# Patient Record
Sex: Male | Born: 1970
Health system: Southern US, Community
[De-identification: ages and names within clinical notes are randomized; demographics above are authoritative.]

---

## 2001-01-29 ENCOUNTER — Emergency Department (HOSPITAL_COMMUNITY): Admission: EM | Admit: 2001-01-29 | Discharge: 2001-01-29 | Payer: Self-pay | Admitting: Emergency Medicine

## 2001-04-16 ENCOUNTER — Emergency Department (HOSPITAL_COMMUNITY): Admission: EM | Admit: 2001-04-16 | Discharge: 2001-04-16 | Payer: Self-pay

## 2001-04-24 ENCOUNTER — Emergency Department (HOSPITAL_COMMUNITY): Admission: EM | Admit: 2001-04-24 | Discharge: 2001-04-24 | Payer: Self-pay | Admitting: Emergency Medicine

## 2001-04-26 ENCOUNTER — Emergency Department (HOSPITAL_COMMUNITY): Admission: EM | Admit: 2001-04-26 | Discharge: 2001-04-26 | Payer: Self-pay | Admitting: Emergency Medicine

## 2001-05-11 ENCOUNTER — Emergency Department (HOSPITAL_COMMUNITY): Admission: EM | Admit: 2001-05-11 | Discharge: 2001-05-11 | Payer: Self-pay

## 2001-05-12 ENCOUNTER — Emergency Department (HOSPITAL_COMMUNITY): Admission: EM | Admit: 2001-05-12 | Discharge: 2001-05-12 | Payer: Self-pay | Admitting: Emergency Medicine

## 2005-01-22 ENCOUNTER — Emergency Department (HOSPITAL_COMMUNITY): Admission: EM | Admit: 2005-01-22 | Discharge: 2005-01-22 | Payer: Self-pay | Admitting: Emergency Medicine

## 2015-03-12 ENCOUNTER — Emergency Department (HOSPITAL_COMMUNITY)
Admission: EM | Admit: 2015-03-12 | Discharge: 2015-03-12 | Disposition: A | Discharge status: Home / Self Care | Payer: No Typology Code available for payment source | Attending: Emergency Medicine | Admitting: Emergency Medicine

## 2015-03-12 ENCOUNTER — Encounter (HOSPITAL_COMMUNITY): Payer: Self-pay

## 2015-03-12 DIAGNOSIS — Y998 Other external cause status: Secondary | ICD-10-CM | POA: Insufficient documentation

## 2015-03-12 DIAGNOSIS — Y9289 Other specified places as the place of occurrence of the external cause: Secondary | ICD-10-CM | POA: Diagnosis not present

## 2015-03-12 DIAGNOSIS — T63461A Toxic effect of venom of wasps, accidental (unintentional), initial encounter: Secondary | ICD-10-CM

## 2015-03-12 DIAGNOSIS — Y9389 Activity, other specified: Secondary | ICD-10-CM | POA: Insufficient documentation

## 2015-03-12 DIAGNOSIS — T63421A Toxic effect of venom of ants, accidental (unintentional), initial encounter: Secondary | ICD-10-CM | POA: Insufficient documentation

## 2015-03-12 DIAGNOSIS — X58XXXA Exposure to other specified factors, initial encounter: Secondary | ICD-10-CM | POA: Insufficient documentation

## 2015-03-12 DIAGNOSIS — Z23 Encounter for immunization: Secondary | ICD-10-CM | POA: Insufficient documentation

## 2015-03-12 DIAGNOSIS — M7989 Other specified soft tissue disorders: Secondary | ICD-10-CM | POA: Diagnosis present

## 2015-03-12 LAB — COMPREHENSIVE METABOLIC PANEL
ALT: 16 U/L — ABNORMAL LOW (ref 17–63)
AST: 21 U/L (ref 15–41)
Albumin: 3.9 g/dL (ref 3.5–5.0)
Alkaline Phosphatase: 56 U/L (ref 38–126)
Anion gap: 7 (ref 5–15)
BUN: 11 mg/dL (ref 6–20)
CO2: 27 mmol/L (ref 22–32)
Calcium: 9.3 mg/dL (ref 8.9–10.3)
Chloride: 105 mmol/L (ref 101–111)
Creatinine, Ser: 1.07 mg/dL (ref 0.61–1.24)
GFR calc Af Amer: >60 mL/min (ref >60)
GFR calc non Af Amer: >60 mL/min (ref >60)
Glucose, Bld: 98 mg/dL (ref 65–99)
Potassium: 4 mmol/L (ref 3.5–5.1)
Sodium: 139 mmol/L (ref 135–145)
Total Bilirubin: 0.5 mg/dL (ref 0.3–1.2)
Total Protein: 7.1 g/dL (ref 6.5–8.1)

## 2015-03-12 MED ORDER — TETANUS-DIPHTH-ACELL PERTUSSIS 5-2.5-18.5 LF-MCG/0.5 IM SUSP
0.5000 mL | Freq: Once | INTRAMUSCULAR | Status: AC
  Administered 2015-03-12: 0.5 mL via INTRAMUSCULAR
  Filled 2015-03-12: qty 0.5

## 2015-03-12 MED ORDER — HYDROCHLOROTHIAZIDE 25 MG PO TABS: 12.5000 mg | ORAL_TABLET | Freq: Every day | ORAL | Status: DC

## 2015-03-12 NOTE — ED Notes (Signed)
Dr. Jacubowitz at the bedside 

## 2015-03-12 NOTE — ED Provider Notes (Signed)
CSN: [id number]     Arrival date & time 03/12/15  4098 History   First MD Initiated Contact with Patient 03/12/15 0930     No chief complaint on file.  chief complaint: Stung by wasp"   (Consider location/radiation/quality/duration/timing/severity/associated sxs/prior Treatment) HPI Patient reports he was stung on his left lateral lower leg by a wasp 3 days ago he complains of "stiffness"at his left lower leg since the event due to swelling. He also wants to be checked out for swelling in both legs which has been there for at least 6 months.. No shortness of breath No other associated symptoms no treatment prior to coming here. Bilateral leg swelling worse with prolonged standing improved with elevation of his legs. Discomfort at site of wasp sting is minimal  No past medical history on file. past medical history negative  No past surgical history on file. No family history on file. History  Substance Use Topics  . Smoking status: Not on file  . Smokeless tobacco: Not on file  . Alcohol Use: Not on file   ex-smoker quit less than one year ago, drinks 7 or 8 alcoholic beverages per week no illicit drugs  Review of Systems  Constitutional: Negative.   HENT: Negative.   Respiratory: Negative.   Cardiovascular: Positive for leg swelling.  Gastrointestinal: Negative.   Musculoskeletal: Negative.   Skin: Positive for wound.       Wasp sting left lower leg  Neurological: Negative.   Psychiatric/Behavioral: Negative.   All other systems reviewed and are negative.     Allergies  Review of patient's allergies indicates not on file.  Home Medications   Prior to Admission medications   Not on File   There were no vitals taken for this visit. Physical Exam  Constitutional: He appears well-developed and well-nourished.  HENT:  Head: Normocephalic and atraumatic.  Eyes: Conjunctivae are normal. Pupils are equal, round, and reactive to light.  Neck: Neck supple. No JVD present. No  tracheal deviation present. No thyromegaly present.  Cardiovascular: Normal rate and regular rhythm.   No murmur heard. Pulmonary/Chest: Effort normal and breath sounds normal.  Abdominal: Soft. Bowel sounds are normal. He exhibits no distension. There is no tenderness.  Musculoskeletal: Normal range of motion. He exhibits edema. He exhibits no tenderness.  2+ pretibial pitting edema bilaterally. DP pulses 2+ bilaterally no tenderness  Neurological: He is alert. Coordination normal.  Skin: Skin is warm and dry. No rash noted.  Brawny changes bilaterally at lower legs and feet. There is a 3 cm reddened area at left lateral lower leg. No stinger present. No fluctuance. Nontender. No red streaks proximally  Psychiatric: He has a normal mood and affect.  Nursing note and vitals reviewed.   ED Course  Procedures (including critical care time) Labs Review Labs Reviewed - No data to display  Imaging Review No results found.   EKG Interpretation None     tdap administered Results for orders placed or performed during the hospital encounter of 03/12/15  Comprehensive metabolic panel  Result Value Ref Range   Sodium 139 135 - 145 mmol/L   Potassium 4.0 3.5 - 5.1 mmol/L   Chloride 105 101 - 111 mmol/L   CO2 27 22 - 32 mmol/L   Glucose, Bld 98 65 - 99 mg/dL   BUN 11 6 - 20 mg/dL   Creatinine, Ser 1.19 0.61 - 1.24 mg/dL   Calcium 9.3 8.9 - 14.7 mg/dL   Total Protein 7.1 6.5 - 8.1 g/dL  Albumin 3.9 3.5 - 5.0 g/dL   AST 21 15 - 41 U/L   ALT 16 (L) 17 - 63 U/L   Alkaline Phosphatase 56 38 - 126 U/L   Total Bilirubin 0.5 0.3 - 1.2 mg/dL   GFR calc non Af Amer >60 >60 mL/min   GFR calc Af Amer >60 >60 mL/min   Anion gap 7 5 - 15   No results found.  MDM  Patient with localized reaction to wasp sting. No systemic reaction. Brawny pitting bilateral leg edema is chronic.  Final diagnoses:  None   plan prescription HCTZ. Referral [facility] Diagnoses #1 wasp  sting #2 peripheral edema      Doug SouSam Donyel Castagnola, MD 03/12/15 1234

## 2015-03-12 NOTE — ED Notes (Signed)
Pt states he has had swelling noticed in the left leg for a few months but Saturday was bite by a wasp on the side of his left calve. Presents today with redness to leg, swelling, and pain.

## 2015-03-12 NOTE — Discharge Instructions (Signed)
Bee, Wasp, or Hornet Sting Elevate your legs above your heart as much as possible to help with swelling. Call the [facility] today to schedule the next available appointment. Return if your condition worsens for any reason. Your caregiver has diagnosed you as having an insect sting. An insect sting appears as a red lump in the skin that sometimes has a tiny hole in the center, or it may have a stinger in the center of the wound. The most common stings are from wasps, hornets and bees. Individuals have different reactions to insect stings.  A normal reaction may cause pain, swelling, and redness around the sting site.  A localized allergic reaction may cause swelling and redness that extends beyond the sting site.  A large local reaction may continue to develop over the next 12 to 36 hours.  On occasion, the reactions can be severe (anaphylactic reaction). An anaphylactic reaction may cause wheezing; difficulty breathing; chest pain; fainting; raised, itchy, red patches on the skin; a sick feeling to your stomach (nausea); vomiting; cramping; or diarrhea. If you have had an anaphylactic reaction to an insect sting in the past, you are more likely to have one again. HOME CARE INSTRUCTIONS   With bee stings, a small sac of poison is left in the wound. Brushing across this with something such as a credit card, or anything similar, will help remove this and decrease the amount of the reaction. This same procedure will not help a wasp sting as they do not leave behind a stinger and poison sac.  Apply a cold compress for 10 to 20 minutes every hour for 1 to 2 days, depending on severity, to reduce swelling and itching.  To lessen pain, a paste made of water and baking soda may be rubbed on the bite or sting and left on for 5 minutes.  To relieve itching and swelling, you may use take medication or apply medicated creams or lotions as directed.  Only take over-the-counter or  prescription medicines for pain, discomfort, or fever as directed by your caregiver.  Wash the sting site daily with soap and water. Apply antibiotic ointment on the sting site as directed.  If you suffered a severe reaction:  If you did not require hospitalization, an adult will need to stay with you for 24 hours in case the symptoms return.  You may need to wear a medical bracelet or necklace stating the allergy.  You and your family need to learn when and how to use an anaphylaxis kit or epinephrine injection.  If you have had a severe reaction before, always carry your anaphylaxis kit with you. SEEK MEDICAL CARE IF:   None of the above helps within 2 to 3 days.  The area becomes red, warm, tender, and swollen beyond the area of the bite or sting.  You have an oral temperature above 102 F (38.9 C). SEEK IMMEDIATE MEDICAL CARE IF:  You have symptoms of an allergic reaction which are:  Wheezing.  Difficulty breathing.  Chest pain.  Lightheadedness or fainting.  Itchy, raised, red patches on the skin.  Nausea, vomiting, cramping or diarrhea. ANY OF THESE SYMPTOMS MAY REPRESENT A SERIOUS PROBLEM THAT IS AN EMERGENCY. Do not wait to see if the symptoms will go away. Get medical help right away. Call your local emergency services (911 in [place] DO NOT drive yourself to the hospital. MAKE SURE YOU:   Understand these instructions.  Will watch your condition.  Will get  help right away if you are not doing well or get worse. Document Released: 08/31/2005 Document Revised: 11/23/2011 Document Reviewed: 02/15/2010 [facility] Patient Information 2015 [place], [place]. This information is not intended to replace advice given to you by your health care provider. Make sure you discuss any questions you have with your health care provider.

## 2015-03-12 NOTE — ED Notes (Signed)
Phlebotomy informed to run istat chem 8 on sample obtained with blood draw. Pt aware of reason for delay.

## 2015-03-12 NOTE — ED Notes (Signed)
Pt is in stable condition upon d/c and ambulates from ED. 

## 2015-05-10 ENCOUNTER — Other Ambulatory Visit (HOSPITAL_COMMUNITY): Payer: Self-pay | Admitting: Internal Medicine

## 2015-05-10 ENCOUNTER — Ambulatory Visit (HOSPITAL_COMMUNITY)
Admission: RE | Admit: 2015-05-10 | Discharge: 2015-05-10 | Disposition: A | Discharge status: Home / Self Care | Payer: No Typology Code available for payment source | Source: Ambulatory Visit | Attending: Vascular Surgery | Admitting: Vascular Surgery

## 2015-05-10 DIAGNOSIS — I8392 Asymptomatic varicose veins of left lower extremity: Secondary | ICD-10-CM | POA: Insufficient documentation

## 2015-05-10 DIAGNOSIS — R600 Localized edema: Secondary | ICD-10-CM | POA: Diagnosis present

## 2016-12-11 DIAGNOSIS — H524 Presbyopia: Secondary | ICD-10-CM | POA: Diagnosis not present

## 2017-08-20 DIAGNOSIS — Z0000 Encounter for general adult medical examination without abnormal findings: Secondary | ICD-10-CM | POA: Diagnosis not present

## 2017-08-25 DIAGNOSIS — Z23 Encounter for immunization: Secondary | ICD-10-CM | POA: Diagnosis not present

## 2017-08-25 DIAGNOSIS — I872 Venous insufficiency (chronic) (peripheral): Secondary | ICD-10-CM | POA: Diagnosis not present

## 2017-08-25 DIAGNOSIS — Z0000 Encounter for general adult medical examination without abnormal findings: Secondary | ICD-10-CM | POA: Diagnosis not present

## 2017-08-26 DIAGNOSIS — Z1212 Encounter for screening for malignant neoplasm of rectum: Secondary | ICD-10-CM | POA: Diagnosis not present

## 2018-05-04 DIAGNOSIS — R51 Headache: Secondary | ICD-10-CM | POA: Diagnosis not present

## 2018-05-04 DIAGNOSIS — G478 Other sleep disorders: Secondary | ICD-10-CM | POA: Diagnosis not present

## 2018-08-23 DIAGNOSIS — Z125 Encounter for screening for malignant neoplasm of prostate: Secondary | ICD-10-CM | POA: Diagnosis not present

## 2018-08-23 DIAGNOSIS — R82998 Other abnormal findings in urine: Secondary | ICD-10-CM | POA: Diagnosis not present

## 2018-08-23 DIAGNOSIS — Z0000 Encounter for general adult medical examination without abnormal findings: Secondary | ICD-10-CM | POA: Diagnosis not present

## 2018-08-29 DIAGNOSIS — Z0000 Encounter for general adult medical examination without abnormal findings: Secondary | ICD-10-CM | POA: Diagnosis not present

## 2018-08-29 DIAGNOSIS — Z23 Encounter for immunization: Secondary | ICD-10-CM | POA: Diagnosis not present

## 2018-08-29 DIAGNOSIS — I872 Venous insufficiency (chronic) (peripheral): Secondary | ICD-10-CM | POA: Diagnosis not present

## 2018-08-29 DIAGNOSIS — Z1389 Encounter for screening for other disorder: Secondary | ICD-10-CM | POA: Diagnosis not present

## 2020-03-19 ENCOUNTER — Encounter: Payer: Self-pay | Admitting: Cardiovascular Disease

## 2020-03-19 ENCOUNTER — Encounter (INDEPENDENT_AMBULATORY_CARE_PROVIDER_SITE_OTHER): Payer: Self-pay

## 2020-03-19 ENCOUNTER — Other Ambulatory Visit: Payer: Self-pay

## 2020-03-19 ENCOUNTER — Ambulatory Visit (INDEPENDENT_AMBULATORY_CARE_PROVIDER_SITE_OTHER): Payer: 59 | Admitting: Cardiovascular Disease

## 2020-03-19 ENCOUNTER — Telehealth: Payer: Self-pay | Admitting: *Deleted

## 2020-03-19 DIAGNOSIS — R600 Localized edema: Secondary | ICD-10-CM | POA: Diagnosis not present

## 2020-03-19 NOTE — Progress Notes (Signed)
03/19/2020 [name]   March 23, 1971  [id number]  Primary Physician Patient, No Pcp Per Primary Cardiologist: Runell Gess MD [name]  HPI:  Marcus Nguyen is a 49 y.o. 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  He was referred by his occupational health nurse for evaluation of lower extreme edema and discoloration of his skin.  He has no cardiac risk factors.  He does not smoke.  Drinks beer occasionally.  There is no family history of heart disease.  Never had a heart attack or stroke.  He denies chest pain or shortness of breath.  He is fairly active especially on his job.  He had lower extremity edema and discoloration of his skin pretibially for years.  Did have a venous Doppler study back in 2016.  He has seen a healthcare provider who recommended compression stockings and diuretics.   No outpatient medications have been marked as taking for the 03/19/20 encounter (Office Visit) with Runell Gess, MD.     No Known Allergies  Social History   Socioeconomic History  . Marital status: Significant Other    Spouse name: Not on file  . Number of children: Not on file  . Years of education: Not on file  . Highest education level: Not on file  Occupational History  . Not on file  Tobacco Use  . Smoking status: Former Games developer  . Smokeless tobacco: Never Used  Substance and Sexual Activity  . Alcohol use: Yes    Comment: socially  . Drug use: No  . Sexual activity: Not on file  Other Topics Concern  . Not on file  Social History Narrative  . Not on file   Social Determinants of Health   Financial Resource Strain:   . Difficulty of Paying Living Expenses:   Food Insecurity:   . Worried About Programme researcher, broadcasting/film/video in the Last Year:   . Barista in the Last Year:   Transportation Needs:   . Freight forwarder (Medical):   Marland Kitchen Lack of Transportation (Non-Medical):    Physical Activity:   . Days of Exercise per Week:   . Minutes of Exercise per Session:   Stress:   . Feeling of Stress :   Social Connections:   . Frequency of Communication with Friends and Family:   . Frequency of Social Gatherings with Friends and Family:   . Attends Religious Services:   . Active Member of Clubs or Organizations:   . Attends Banker Meetings:   Marland Kitchen Marital Status:   Intimate Partner Violence:   . Fear of Current or Ex-Partner:   . Emotionally Abused:   Marland Kitchen Physically Abused:   . Sexually Abused:      Review of Systems: General: negative for chills, fever, night sweats or weight changes.  Cardiovascular: negative for chest pain, dyspnea on exertion, edema, orthopnea, palpitations, paroxysmal nocturnal dyspnea or shortness of breath Dermatological: negative for rash Respiratory: negative for cough or wheezing Urologic: negative for hematuria Abdominal: negative for nausea, vomiting, diarrhea, bright red blood per rectum, melena, or hematemesis Neurologic: negative for visual changes, syncope, or dizziness All other systems reviewed and are otherwise negative except as noted above.    Blood pressure (!) 134/94, pulse 68, height 5\' 7"  (1.702 m), weight 194 lb 12.8 oz (88.4 kg), SpO2 98 %.  General appearance: alert and no distress Neck: no adenopathy,  no carotid bruit, no JVD, supple, symmetrical, trachea midline and thyroid not enlarged, symmetric, no tenderness/mass/nodules Lungs: clear to auscultation bilaterally Heart: regular rate and rhythm, S1, S2 normal, no murmur, click, rub or gallop Extremities: 1+ lower extremity edema with venous stasis changes. Pulses: 2+ and symmetric Bilateral venous stasis changes Skin: Skin color, texture, turgor normal. No rashes or lesions Neurologic: Alert and oriented X 3, normal strength and tone. Normal symmetric reflexes. Normal coordination and gait  EKG sinus rhythm at 68 with LVH voltage.  Personally  reviewed this EKG.  ASSESSMENT AND PLAN:   Bilateral lower extremity edema [name] was referred to me by his occupational health nurse at his place of employment because of lower extremity edema and venous stasis changes.  He said he had these for years.  He has seen healthcare providers in the past who have recommended compression stockings and diuretics.  He has no other symptoms and denies claudication chest pain or shortness of breath.  And will get a 2D echo and lower extremity extremity venous reflux studies.      Runell Gess MD FACP,FACC,FAHA, [facility] 03/19/2020 10:44 AM

## 2020-03-19 NOTE — Telephone Encounter (Addendum)
Called Dr Sande Brothers office to request records again, [facility] requested Thursday.  Per Dr Sande Brothers office they will contact NP again to try to get records

## 2020-03-19 NOTE — Assessment & Plan Note (Signed)
Marcus Nguyen was referred to me by his occupational health nurse at his place of employment because of lower extremity edema and venous stasis changes.  He said he had these for years.  He has seen healthcare providers in the past who have recommended compression stockings and diuretics.  He has no other symptoms and denies claudication chest pain or shortness of breath.  And will get a 2D echo and lower extremity extremity venous reflux studies.

## 2020-03-19 NOTE — Patient Instructions (Addendum)
Medication Instructions:  Your physician recommends that you continue on your current medications as directed. Please refer to the Current Medication list given to you today.  *If you need a refill on your cardiac medications before your next appointment, please call your pharmacy*  Lab Work: NONE   Testing/Procedures: Your physician has requested that you have an echocardiogram. Echocardiography is a painless test that uses sound waves to create images of your heart. It provides your doctor with information about the size and shape of your heart and how well your heart's chambers and valves are working. This procedure takes approximately one hour. There are no restrictions for this procedure.  VENOUS REFLUX STUDY  Follow-Up: AS NEEDED

## 2020-03-21 ENCOUNTER — Ambulatory Visit (HOSPITAL_COMMUNITY): Payer: 59 | Attending: Cardiology

## 2020-03-21 ENCOUNTER — Other Ambulatory Visit: Payer: Self-pay

## 2020-03-21 DIAGNOSIS — R600 Localized edema: Secondary | ICD-10-CM

## 2020-03-22 ENCOUNTER — Other Ambulatory Visit: Payer: Self-pay

## 2020-04-10 ENCOUNTER — Other Ambulatory Visit: Payer: Self-pay

## 2020-04-10 DIAGNOSIS — I77819 Aortic ectasia, unspecified site: Secondary | ICD-10-CM

## 2020-04-10 DIAGNOSIS — I712 Thoracic aortic aneurysm, without rupture, unspecified: Secondary | ICD-10-CM

## 2020-04-11 ENCOUNTER — Telehealth: Payer: Self-pay | Admitting: Cardiovascular Disease

## 2020-04-11 NOTE — Telephone Encounter (Signed)
Spoke with patient regarding appointment for CTA chest aorta ordered by Dr. Laretta Alstrom Friday 04/26/20 at 1:00pm at [facility]----[address]----arrival time 12:45 pm--Liquids only 2 hours prior to study.  Patient will com n 04/22/20 or 04/23/20 for lab work. Will also mail information to patient.  He voiced his understanding.

## 2020-04-15 ENCOUNTER — Other Ambulatory Visit: Payer: Self-pay

## 2020-04-15 ENCOUNTER — Ambulatory Visit (HOSPITAL_COMMUNITY)
Admission: RE | Admit: 2020-04-15 | Discharge: 2020-04-15 | Disposition: A | Discharge status: Home / Self Care | Payer: 59 | Source: Ambulatory Visit | Attending: Cardiology | Admitting: Cardiology

## 2020-04-15 DIAGNOSIS — R600 Localized edema: Secondary | ICD-10-CM

## 2020-04-16 ENCOUNTER — Telehealth: Payer: Self-pay | Admitting: Cardiovascular Disease

## 2020-04-16 ENCOUNTER — Other Ambulatory Visit: Payer: Self-pay

## 2020-04-16 DIAGNOSIS — R600 Localized edema: Secondary | ICD-10-CM

## 2020-04-16 DIAGNOSIS — I872 Venous insufficiency (chronic) (peripheral): Secondary | ICD-10-CM

## 2020-04-16 NOTE — Telephone Encounter (Signed)
    Pt said is returning call from [facility] to get vas results

## 2020-04-16 NOTE — Telephone Encounter (Signed)
Spoke to patient lower ext doppler results given.Dr.Berry advised needs to be seen by [facility].Advised order was placed this morning.He will get a call from [facility] with appointment.

## 2020-04-16 NOTE — Progress Notes (Signed)
amb ref °

## 2020-04-19 ENCOUNTER — Other Ambulatory Visit: Payer: No Typology Code available for payment source

## 2020-04-24 ENCOUNTER — Telehealth: Payer: Self-pay | Admitting: Cardiovascular Disease

## 2020-04-24 NOTE — Telephone Encounter (Signed)
Left message for patient that referral to [facility] was ordered - unsure of status Provided him with their phone #[phone]

## 2020-04-24 NOTE — Telephone Encounter (Signed)
Patient states he was supposed to get a call to schedule an appointment for his vericose veins and has not gotten a call yet.

## 2020-04-26 ENCOUNTER — Other Ambulatory Visit: Payer: Self-pay

## 2020-04-26 ENCOUNTER — Ambulatory Visit (INDEPENDENT_AMBULATORY_CARE_PROVIDER_SITE_OTHER)
Admission: RE | Admit: 2020-04-26 | Discharge: 2020-04-26 | Disposition: A | Discharge status: Home / Self Care | Payer: 59 | Source: Ambulatory Visit | Attending: Cardiovascular Disease | Admitting: Cardiovascular Disease

## 2020-04-26 DIAGNOSIS — I712 Thoracic aortic aneurysm, without rupture, unspecified: Secondary | ICD-10-CM

## 2020-04-26 MED ORDER — IOHEXOL 350 MG/ML SOLN
100.0000 mL | Freq: Once | INTRAVENOUS | Status: AC | PRN
  Administered 2020-04-26: 100 mL via INTRAVENOUS

## 2020-04-29 ENCOUNTER — Other Ambulatory Visit: Payer: Self-pay

## 2020-04-29 ENCOUNTER — Ambulatory Visit (INDEPENDENT_AMBULATORY_CARE_PROVIDER_SITE_OTHER): Payer: 59 | Admitting: Physician Assistant

## 2020-04-29 VITALS — BP 139/95 | HR 72 | Temp 98.6°F | Resp 20 | Ht 67.0 in | Wt 193.3 lb

## 2020-04-29 DIAGNOSIS — M7989 Other specified soft tissue disorders: Secondary | ICD-10-CM

## 2020-04-29 DIAGNOSIS — I872 Venous insufficiency (chronic) (peripheral): Secondary | ICD-10-CM

## 2020-04-29 NOTE — Progress Notes (Signed)
Requested by:  Alysia Penna, MD [address] [place],  [place] [address]  Reason for consultation: Bilateral lower extremity edema   History of Present Illness   Marcus Nguyen is a 50 y.o. (11-17-1970) male who presents for evaluation of bilateral lower extremity edema. States that he has had issue with swelling for several years now and also darkening of the skin on his legs. He has noticed more recently that the darkened areas are progressing. The left leg swelling and darkening is worse than the right. He occasionally will have tiredness in his legs as well but he stands for prolonged periods of time at work. He has worked for a long time in Scientist, water quality and has been told by the nurse at work to have his legs looked at. He has previously been advised to wear compression stockings but has not tried them. He has done trial of Lasix per his PCP but did not see any real impovement with his swelling. He does not elevate and has not worn compression stockings before. He denies any pain, aching, burning itching, bleeding, or ulceration. He states his grandmother has a lot of issues with her veins as well as an uncle. No family or personal history of DVT.   Venous symptoms include: tired, swelling Onset/duration: >5 years Occupation: works in Naval architect for [employer] Aggravating factors: prolonged standing and/or ambulation Alleviating factors: none Compression:  Has not used before Pain medications: none Previous vein procedures: none History of DVT:  no  No past medical history on file.  No past surgical history on file.  Social History   Socioeconomic History  . Marital status: Significant Other    Spouse name: Not on file  . Number of children: 1  . Years of education: Not on file  . Highest education level: Not on file  Occupational History  . Not on file  Tobacco Use  . Smoking status: Former Games developer  . Smokeless tobacco: Never Used  Substance and Sexual  Activity  . Alcohol use: Yes    Comment: socially  . Drug use: No  . Sexual activity: Not on file  Other Topics Concern  . Not on file  Social History Narrative  . Not on file   Social Determinants of Health   Financial Resource Strain:   . Difficulty of Paying Living Expenses:   Food Insecurity:   . Worried About Programme researcher, broadcasting/film/video in the Last Year:   . Barista in the Last Year:   Transportation Needs:   . Freight forwarder (Medical):   Marland Kitchen Lack of Transportation (Non-Medical):   Physical Activity:   . Days of Exercise per Week:   . Minutes of Exercise per Session:   Stress:   . Feeling of Stress :   Social Connections:   . Frequency of Communication with Friends and Family:   . Frequency of Social Gatherings with Friends and Family:   . Attends Religious Services:   . Active Member of Clubs or Organizations:   . Attends Banker Meetings:   Marland Kitchen Marital Status:   Intimate Partner Violence:   . Fear of Current or Ex-Partner:   . Emotionally Abused:   Marland Kitchen Physically Abused:   . Sexually Abused:    No family history on file.  Current Outpatient Medications  Medication Sig Dispense Refill  . triamcinolone ointment (KENALOG) 0.1 % Apply topically.     No current facility-administered medications for this visit.  No Known Allergies  REVIEW OF SYSTEMS (negative unless checked):   Cardiac:  []  Chest pain or chest pressure? []  Shortness of breath upon activity? []  Shortness of breath when lying flat? []  Irregular heart rhythm?  Vascular:  []  Pain in calf, thigh, or hip brought on by walking? []  Pain in feet at night that wakes you up from your sleep? []  Blood clot in your veins? []  Leg swelling?  Pulmonary:  []  Oxygen at home? []  Productive cough? []  Wheezing?  Neurologic:  []  Sudden weakness in arms or legs? []  Sudden numbness in arms or legs? []  Sudden onset of difficult speaking or slurred speech? []  Temporary loss of vision in  one eye? []  Problems with dizziness?  Gastrointestinal:  []  Blood in stool? []  Vomited blood?  Genitourinary:  []  Burning when urinating? []  Blood in urine?  Psychiatric:  []  Major depression  Hematologic:  []  Bleeding problems? []  Problems with blood clotting?  Dermatologic:  []  Rashes or ulcers?  Constitutional:  []  Fever or chills?  Ear/Nose/Throat:  []  Change in hearing? []  Nose bleeds? []  Sore throat?  Musculoskeletal:  []  Back pain? []  Joint pain? []  Muscle pain?   Physical Examination     Vitals:   04/29/20 0846  BP: (!) 139/95  Pulse: 72  Resp: 20  Temp: 98.6 F (37 C)  TempSrc: Temporal  SpO2: 97%  Weight: 193 lb 4.8 oz (87.7 kg)  Height: 5\' 7"  (1.702 m)   Body mass index is 30.28 kg/m.  General:  WDWN in NAD; vital signs documented above Gait: Not observed HENT: WNL, normocephalic Pulmonary: normal non-labored breathing , without wheezing Cardiac: regular HR, without  Murmurs without carotid bruit Abdomen: soft, NT, no masses Vascular Exam/Pulses: 2+ radial, 2+ femoral, 2+ DP and PT pulses bilaterally Extremities: with varicose veins of bilateral lower extremities, without reticular veins, with mild edema bilatearlly, with stasis pigmentation of mid to distal left leg and distal right leg, with lipodermatosclerosis of left leg, without ulcers Musculoskeletal: no muscle wasting or atrophy  Neurologic: A&O X 3;  No focal weakness or paresthesias are detected Psychiatric:  The pt has Normal affect.  Non-invasive Vascular Imaging   BLE Venous Insufficiency Duplex (04/29/20):   RLE:   No DVT and SVT  GSV reflux mid thigh, and knee to mid calf  GSV diameter 0.35- 0.51  No SSV reflux   CFV, FV, popliteal deep venous reflux   LLE:  No DVT and SVT  GSV reflux SFJ to distal thigh, mid calf  GSV diameter 0.25 -0.55  SSV reflux in mid calf  CFV, FV deep venous reflux   Medical Decision Making   Marcus Nguyen is a 49 y.o.  male who presents with: BLE chronic venous insufficiency,  varicose veins chronic venous stasis changes. Venous duplex shows no DVT but significant bilateral venous insufficiency of both the deep and superficial systems, left greater than right.  Based on the patient's history and examination, I have recommended daily elevation, thigh high compression stockings and exercise therapy  He is a candidate for venous ablation of left lower extremity. The right lower extremity does not have SFJ reflux based on current venous duplex study  I discussed with the patient the use of her 20-30 mm thigh high compression stockings and need for 3 month trial of such.  The patient will follow up in 3 months with Dr.Fields or Dr.  Thank you for allowing to participate in this patient's care.   ,  PA-C [facility] Office: [phone]  04/29/2020, 9:18 AM  Clinic MD: Dr. Myra Gianotti

## 2020-05-28 ENCOUNTER — Other Ambulatory Visit: Payer: Self-pay

## 2020-05-28 DIAGNOSIS — I712 Thoracic aortic aneurysm, without rupture, unspecified: Secondary | ICD-10-CM

## 2020-05-28 NOTE — Progress Notes (Signed)
bmet  

## 2020-07-30 ENCOUNTER — Encounter: Payer: Self-pay | Admitting: Cardiovascular Disease

## 2020-07-30 ENCOUNTER — Other Ambulatory Visit: Payer: Self-pay

## 2020-07-30 ENCOUNTER — Ambulatory Visit (INDEPENDENT_AMBULATORY_CARE_PROVIDER_SITE_OTHER): Payer: 59 | Admitting: Cardiovascular Disease

## 2020-07-30 DIAGNOSIS — R600 Localized edema: Secondary | ICD-10-CM | POA: Diagnosis not present

## 2020-07-30 NOTE — Assessment & Plan Note (Signed)
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  He had 2D echo that showed preserved LV systolic function with normal diastolic function.  Venous Doppler showed no evidence of DVT with evidence of venous reflux.  He is wearing knee-high compression stockings on the right which is improved his edema.  He has been on diuretics in the past without benefit.  I have arranged for him to see Dr. Edilia Bo at [facility] for vascular evaluation of venous reflux.

## 2020-07-30 NOTE — Progress Notes (Signed)
07/30/2020 Marcus Nguyen   02-16-1971  [id number]  Primary Physician Alysia Penna, MD Primary Cardiologist: Runell Gess MD Nicholes Calamity, [place]  HPI:  Marcus Nguyen is a 49 y.o.  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  He was referred by his occupational health nurse for evaluation of lower extreme edema and discoloration of his skin.  He has no cardiac risk factors.  He does not smoke.  Drinks beer occasionally.  There is no family history of heart disease.  Never had a heart attack or stroke.  He denies chest pain or shortness of breath.  He is fairly active especially on his job.  He had lower extremity edema and discoloration of his skin pretibially for years.  Did have a venous Doppler study back in 2016.  He has seen a healthcare provider who recommended compression stockings and diuretics.  He is currently wearing a knee-high compression stocking on the right side which is improved his edema.  A 2D echo revealed normal LV systolic function and suggested a ascending thoracic aortic aneurysm although a chest CTA performed 04/26/2020 did not show this.  Venous Dopplers did show reflux without DVT.  I referred him to Dr. Cari Caraway at [facility]  for further evaluation.   Current Meds  Medication Sig  . triamcinolone ointment (KENALOG) 0.1 % Apply topically.     No Known Allergies  Social History   Socioeconomic History  . Marital status: Significant Other    Spouse name: Not on file  . Number of children: 1  . Years of education: Not on file  . Highest education level: Not on file  Occupational History  . Not on file  Tobacco Use  . Smoking status: Former Games developer  . Smokeless tobacco: Never Used  Substance and Sexual Activity  . Alcohol use: Yes    Comment: socially  . Drug use: No  . Sexual activity: Not on file  Other Topics Concern  . Not on file  Social History Narrative  .  Not on file   Social Determinants of Health   Financial Resource Strain:   . Difficulty of Paying Living Expenses: Not on file  Food Insecurity:   . Worried About Programme researcher, broadcasting/film/video in the Last Year: Not on file  . Ran Out of Food in the Last Year: Not on file  Transportation Needs:   . Lack of Transportation (Medical): Not on file  . Lack of Transportation (Non-Medical): Not on file  Physical Activity:   . Days of Exercise per Week: Not on file  . Minutes of Exercise per Session: Not on file  Stress:   . Feeling of Stress : Not on file  Social Connections:   . Frequency of Communication with Friends and Family: Not on file  . Frequency of Social Gatherings with Friends and Family: Not on file  . Attends Religious Services: Not on file  . Active Member of Clubs or Organizations: Not on file  . Attends Banker Meetings: Not on file  . Marital Status: Not on file  Intimate Partner Violence:   . Fear of Current or Ex-Partner: Not on file  . Emotionally Abused: Not on file  . Physically Abused: Not on file  . Sexually Abused: Not on file     Review of Systems: General: negative for chills, fever, night sweats or weight changes.  Cardiovascular: negative for chest pain,  dyspnea on exertion, edema, orthopnea, palpitations, paroxysmal nocturnal dyspnea or shortness of breath Dermatological: negative for rash Respiratory: negative for cough or wheezing Urologic: negative for hematuria Abdominal: negative for nausea, vomiting, diarrhea, bright red blood per rectum, melena, or hematemesis Neurologic: negative for visual changes, syncope, or dizziness All other systems reviewed and are otherwise negative except as noted above.    Blood pressure 139/85, pulse 72, height 5\' 7"  (1.702 m), weight 195 lb 9.6 oz (88.7 kg), SpO2 97 %.  General appearance: alert and no distress Neck: no adenopathy, no carotid bruit, no JVD, supple, symmetrical, trachea midline and thyroid not  enlarged, symmetric, no tenderness/mass/nodules Lungs: clear to auscultation bilaterally Heart: regular rate and rhythm, S1, S2 normal, no murmur, click, rub or gallop Extremities: extremities normal, atraumatic, no cyanosis or edema Pulses: 2+ and symmetric Skin: Skin color, texture, turgor normal. No rashes or lesions Neurologic: Alert and oriented X 3, normal strength and tone. Normal symmetric reflexes. Normal coordination and gait  EKG not performed today  ASSESSMENT AND PLAN:   Bilateral lower extremity edema [name] was sent to me by his occupational nurse at his place of business because of lower extremity edema.  He had 2D echo that showed preserved LV systolic function with normal diastolic function.  Venous Doppler showed no evidence of DVT with evidence of venous reflux.  He is wearing knee-high compression stockings on the right which is improved his edema.  He has been on diuretics in the past without benefit.  I have arranged for him to see Dr. Gayla Doss at [facility] for vascular evaluation of venous reflux.      Edilia Bo MD FACP,FACC,FAHA, [facility] 07/30/2020 10:50 AM

## 2020-07-30 NOTE — Patient Instructions (Signed)
Medication Instructions:  Your physician recommends that you continue on your current medications as directed. Please refer to the Current Medication list given to you today.  *If you need a refill on your cardiac medications before your next appointment, please call your pharmacy*  Follow-Up: At [facility], you and your health needs are our priority.  As part of our continuing mission to provide you with exceptional heart care, we have created designated Provider Care Teams.  These Care Teams include your primary Cardiologist (physician) and Advanced Practice Providers (APPs -  Physician Assistants and Nurse Practitioners) who all work together to provide you with the care you need, when you need it.  We recommend signing up for the patient portal called "MyChart".  Sign up information is provided on this After Visit Summary.  MyChart is used to connect with patients for Virtual Visits (Telemedicine).  Patients are able to view lab/test results, encounter notes, upcoming appointments, etc.  Non-urgent messages can be sent to your provider as well.   To learn more about what you can do with MyChart, go to [email or web address].    Your next appointment:   You may see Dr. Allyson Sabal as needed.  If you need to be seen please give our office a call.

## 2020-08-01 ENCOUNTER — Ambulatory Visit: Payer: 59 | Admitting: Vascular Surgery

## 2020-10-24 ENCOUNTER — Other Ambulatory Visit: Payer: Self-pay

## 2020-10-24 ENCOUNTER — Ambulatory Visit (INDEPENDENT_AMBULATORY_CARE_PROVIDER_SITE_OTHER): Payer: 59 | Admitting: Vascular Surgery

## 2020-10-24 ENCOUNTER — Encounter: Payer: Self-pay | Admitting: Vascular Surgery

## 2020-10-24 VITALS — BP 121/77 | HR 79 | Temp 98.1°F | Resp 18 | Ht 68.0 in | Wt 199.2 lb

## 2020-10-24 DIAGNOSIS — I872 Venous insufficiency (chronic) (peripheral): Secondary | ICD-10-CM | POA: Diagnosis not present

## 2020-10-24 NOTE — Progress Notes (Signed)
REASON FOR VISIT:   79-month follow-up visit.  MEDICAL ISSUES:   CHRONIC VENOUS INSUFFICIENCY: This patient has CEAP C4 venous disease.  He has significant symptoms from venous hypertension.  He has been wearing his thigh-high compression stockings with a gradient of 20 to 30 mmHg, elevating his legs and exercising.  He continues to have significant symptoms and therefore I think he would benefit from laser ablation of the left great saphenous vein.   I have discussed the indications for endovenous laser ablation of the left GSV, that is to lower the pressure in the veins and potentially help relieve the symptoms from venous hypertension. I have also discussed alternative options including conservative treatment with leg elevation, compression therapy, exercise, avoiding prolonged sitting and standing, and weight management. I have discussed the potential complications of the procedure, including, but not limited to: bleeding, bruising, leg swelling, nerve injury, skin burns, significant pain from phlebitis, deep venous thrombosis, or failure of the vein to close.  I have also explained that venous insufficiency is a chronic disease, and that the patient is at risk for recurrent varicose veins in the future.  All of the patient's questions were encouraged and answered. They are agreeable to proceed.   HPI:   Marcus Nguyen is a pleasant 50 y.o. male who was seen by Graceann Congress, PA on 04/29/2020.  The patient had been referred by Dr. Nanetta Batty with chronic venous insufficiency.  The patient had bilateral lower extremity edema and hyperpigmentation consistent with chronic venous insufficiency.  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  He had significant symptoms in both legs which were aggravated by standing and ambulation.  He had no previous history of DVT and no previous venous procedures.  He had deep and superficial venous reflux bilaterally.  He  was encouraged to elevate his legs, prescribed thigh-high compression stockings with a gradient of 20 to 30 mmHg, and exercise.  He comes in for 18-month follow-up visit.  He tells me that he developed significant aching pain and heaviness in addition to a tired feeling in his legs which is aggravated by standing and relieved somewhat with elevation.  He works standing at work for 8 hours and his symptoms are worse at the end of the day.  He has been wearing his thigh-high compression stockings which do help his symptoms and help his swelling some.  He has no previous history of DVT and has had no previous venous procedures.  He had been having significant swelling especially on the left side and this has improved since he is been wearing his stockings.  History reviewed. No pertinent past medical history.  History reviewed. No pertinent family history.  SOCIAL HISTORY: Social History   Tobacco Use  . Smoking status: Former Games developer  . Smokeless tobacco: Never Used  Substance Use Topics  . Alcohol use: Yes    Comment: socially    No Known Allergies  Current Outpatient Medications  Medication Sig Dispense Refill  . triamcinolone ointment (KENALOG) 0.1 % Apply topically.     No current facility-administered medications for this visit.    REVIEW OF SYSTEMS:  [X]  denotes positive finding, [ ]  denotes negative finding Cardiac  Comments:  Chest pain or chest pressure:    Shortness of breath upon exertion:    Short of breath when lying flat:    Irregular heart rhythm:        Vascular    Pain in calf,  thigh, or hip brought on by ambulation:    Pain in feet at night that wakes you up from your sleep:     Blood clot in your veins:    Leg swelling:  x       Pulmonary    Oxygen at home:    Productive cough:     Wheezing:         Neurologic    Sudden weakness in arms or legs:     Sudden numbness in arms or legs:     Sudden onset of difficulty speaking or slurred speech:     Temporary loss of vision in one eye:     Problems with dizziness:         Gastrointestinal    Blood in stool:     Vomited blood:         Genitourinary    Burning when urinating:     Blood in urine:        Psychiatric    Major depression:         Hematologic    Bleeding problems:    Problems with blood clotting too easily:        Skin    Rashes or ulcers:        Constitutional    Fever or chills:     PHYSICAL EXAM:   Vitals:   10/24/20 1413  BP: 121/77  Pulse: 79  Resp: 18  Temp: 98.1 F (36.7 C)  TempSrc: Temporal  SpO2: 97%  Weight: 199 lb 3.2 oz (90.4 kg)  Height: 5\' 8"  (1.727 m)   GENERAL: The patient is a well-nourished male, in no acute distress. The vital signs are documented above. CARDIAC: There is a regular rate and rhythm.  VASCULAR: I do not detect carotid bruits. He has palpable femoral, popliteal, dorsalis pedis, posterior tibial pulses bilaterally. He has hyperpigmentation bilaterally more significantly on the left side.       I did look at his left great saphenous vein myself with the SonoSite.  He has significant reflux from the saphenofemoral junction to the knee.  The vein is significantly dilated.  I would likely cannulate the vein just above the knee.  PULMONARY: There is good air exchange bilaterally without wheezing or rales. ABDOMEN: Soft and non-tender with normal pitched bowel sounds.  MUSCULOSKELETAL: There are no major deformities or cyanosis. NEUROLOGIC: No focal weakness or paresthesias are detected. SKIN: He has hyperpigmentation bilaterally as shown above. PSYCHIATRIC: The patient has a normal affect.  DATA:    VENOUS DUPLEX: I have reviewed the venous duplex scan that was done on 8-22.  On the left side, which is the more symptomatic side, he has no evidence of DVT or superficial venous thrombosis.  He does have deep venous reflux involving the common femoral vein and femoral vein.  He has superficial venous reflux in the  left great saphenous vein from the saphenofemoral junction to the distal thigh.  Diameters of the vein ranged from 0.46-0.47 cm.  On the right side he has no evidence of DVT or superficial venous thrombosis.  He has deep venous reflux involving the common femoral vein, femoral vein, and popliteal vein.  He has minimal superficial venous reflux on the right.  02-25-1995 [facility] [phone]

## 2021-01-06 DIAGNOSIS — I8393 Asymptomatic varicose veins of bilateral lower extremities: Secondary | ICD-10-CM

## 2021-01-23 ENCOUNTER — Other Ambulatory Visit: Payer: Self-pay | Admitting: *Deleted

## 2021-01-23 DIAGNOSIS — I83812 Varicose veins of left lower extremities with pain: Secondary | ICD-10-CM

## 2021-01-30 ENCOUNTER — Other Ambulatory Visit: Payer: Self-pay

## 2021-01-30 ENCOUNTER — Encounter: Payer: Self-pay | Admitting: Vascular Surgery

## 2021-01-30 ENCOUNTER — Ambulatory Visit: Payer: 59 | Admitting: Vascular Surgery

## 2021-01-30 VITALS — BP 145/90 | HR 76 | Temp 98.2°F | Resp 16 | Ht 67.0 in | Wt 200.0 lb

## 2021-01-30 DIAGNOSIS — I83812 Varicose veins of left lower extremities with pain: Secondary | ICD-10-CM | POA: Diagnosis not present

## 2021-01-30 DIAGNOSIS — I872 Venous insufficiency (chronic) (peripheral): Secondary | ICD-10-CM

## 2021-01-30 HISTORY — PX: ENDOVENOUS ABLATION SAPHENOUS VEIN W/ LASER: SUR449

## 2021-01-30 NOTE — Progress Notes (Signed)
     Laser Ablation Procedure    Date: 01/30/2021   Marcus Nguyen DOB:July 26, 1971  Consent signed: Yes      Surgeon: Cari Caraway MD   Procedure: Laser Ablation: left Greater Saphenous Vein  BP (!) 145/90 (BP Location: Left Arm, Patient Position: Sitting, Cuff Size: Normal)   Pulse 76   Temp 98.2 F (36.8 C) (Temporal)   Resp 16   Ht 5\' 7"  (1.702 m)   Wt 200 lb (90.7 kg)   SpO2 96%   BMI 31.32 kg/m   Tumescent Anesthesia: 350 cc 0.9% NaCl with 50 cc Lidocaine HCL 1%  and 15 cc 8.4% NaHCO3  Local Anesthesia: 6 cc Lidocaine HCL and NaHCO3 (ratio 2:1)  7 watts continuous mode     Total energy: 1572 Joules    Total time: 224 seconds Treatment Length 32 cm   Laser Fiber Ref. #                              Lot # [id number]     Patient tolerated procedure well  Notes: Patient wore face mask.  All staff members wore facial masks and facial shields/goggles.    Description of Procedure:  After marking the course of the secondary varicosities, the patient was placed on the operating table in the supine position, and the left leg was prepped and draped in sterile fashion.   Local anesthetic was administered and under ultrasound guidance the saphenous vein was accessed with a micro needle and guide wire; then the mirco puncture sheath was placed.  A guide wire was inserted saphenofemoral junction , followed by a 5 french sheath.  The position of the sheath and then the laser fiber below the junction was confirmed using the ultrasound.  Tumescent anesthesia was administered along the course of the saphenous vein using ultrasound guidance. The patient was placed in Trendelenburg position and protective laser glasses were placed on patient and staff, and the laser was fired at 7 watts continuous mode for a total of 1572 joules.       Steri strip was applied to the IV insertion site and ABD pads and thigh high compression stockings were applied.  Ace wrap bandages were  applied over the left thigh and at the top of the saphenofemoral junction. Blood loss was less than 15 cc.  Discharge instructions reviewed with patient and hardcopy of discharge instructions given to patient to take home. The patient ambulated out of the operating room having tolerated the procedure well.

## 2021-01-30 NOTE — Progress Notes (Signed)
   Patient name: Marcus Nguyen MRN: [id number] DOB: [birth year] Sex: male  REASON FOR VISIT: For laser ablation of the left great saphenous vein  HPI: Marcus Nguyen is a 50 y.o. male who I saw on 10/24/2020 with chronic venous insufficiency.  He had CEAP C4a venous disease.  He was having significant symptoms from his venous hypertension and had failed conservative treatment.  He had been elevating his legs, wearing his thigh-high compression stockings with a gradient of 20 to 30 mmHg, and exercising.  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  He had no previous history of DVT and no previous venous procedures.  He had both deep and superficial venous reflux bilaterally.  On exam the patient had hyperpigmentation.  I did look at his left great saphenous vein myself with the SonoSite.  He had significant reflux to the level of the knee.  The vein was significantly dilated.  I felt that I could cannulate the vein just above the knee.  Current Outpatient Medications  Medication Sig Dispense Refill  . triamcinolone ointment (KENALOG) 0.1 % Apply topically.     No current facility-administered medications for this visit.    PHYSICAL EXAM: Vitals:   01/30/21 1057  BP: (!) 145/90  Pulse: 76  Resp: 16  Temp: 98.2 F (36.8 C)  TempSrc: Temporal  SpO2: 96%  Weight: 200 lb (90.7 kg)  Height: 5\' 7"  (1.702 m)    PROCEDURE: Laser ablation left great saphenous vein  TECHNIQUE: The patient was taken to the exam room and placed supine on the table.  I looked with the SonoSite and I felt that I could cannulate the vein in the proximal calf.  The left leg was prepped and draped in usual sterile fashion.  Under ultrasound guidance, after the skin was anesthetized, I cannulated the great saphenous vein in the proximal calf but the wire would not advanced on multiple occasions.  It looks like the vein was spasming at this level.  I therefore had to go to the distal  thigh where again under ultrasound guidance, after the skin was anesthetized, the vein was cannulated with a micropuncture needle and a micropuncture sheath was introduced over the wire.  The J-wire was then advanced to below the saphenofemoral junction.  The sheath was advanced over the wire and positioned approximately 2-1/2 cm distal to the saphenofemoral junction.  The wire and dilator were removed.  The laser fiber was positioned at the end of the sheath and the sheath retracted.  The patient was then administered circumflex tumescent anesthesia circumferentially around the vein.  The patient was placed in Trendelenburg.  Laser ablation was then performed from approximately 2.5 cm distal to the saphenofemoral junction to the distal thigh.  32 cm of the length were treated.  We used 50 J/cm at 7 W.  A pressure dressing was applied.  The patient tolerated the procedure well.  He will return next week for a follow-up duplex.  [facility] [place] [phone]

## 2021-02-06 ENCOUNTER — Ambulatory Visit (HOSPITAL_COMMUNITY)
Admission: RE | Admit: 2021-02-06 | Discharge: 2021-02-06 | Disposition: A | Discharge status: Home / Self Care | Payer: 59 | Source: Ambulatory Visit | Attending: Vascular Surgery | Admitting: Vascular Surgery

## 2021-02-06 ENCOUNTER — Encounter: Payer: Self-pay | Admitting: Vascular Surgery

## 2021-02-06 ENCOUNTER — Other Ambulatory Visit: Payer: Self-pay

## 2021-02-06 ENCOUNTER — Ambulatory Visit (INDEPENDENT_AMBULATORY_CARE_PROVIDER_SITE_OTHER): Payer: 59 | Admitting: Vascular Surgery

## 2021-02-06 VITALS — BP 154/95 | HR 66 | Temp 98.2°F | Resp 18 | Ht 67.0 in | Wt 200.0 lb

## 2021-02-06 DIAGNOSIS — I83812 Varicose veins of left lower extremities with pain: Secondary | ICD-10-CM | POA: Insufficient documentation

## 2021-02-06 DIAGNOSIS — Z48812 Encounter for surgical aftercare following surgery on the circulatory system: Secondary | ICD-10-CM

## 2021-02-06 NOTE — Progress Notes (Signed)
   Patient name: Marcus Nguyen MRN: [id number] DOB: March 16, 1971 Sex: male  REASON FOR VISIT: Follow-up after laser ablation of the left great saphenous vein.  HPI: Marcus Nguyen is a 50 y.o. male who had presented with chronic venous insufficiency.  He had CEAP C4a venous disease.  He failed conservative treatment and was felt to be a good candidate for laser ablation of the left great saphenous vein.  On 01/30/2021 he underwent laser ablation of the left great saphenous vein from 2 and half centimeters distal to the saphenofemoral junction to the distal thigh.  He comes in for a 1 week follow-up visit.  He denies any chest pain or shortness of breath.  Current Outpatient Medications  Medication Sig Dispense Refill  . triamcinolone ointment (KENALOG) 0.1 % Apply topically.     No current facility-administered medications for this visit.   REVIEW OF SYSTEMS: Arly.Keller ] denotes positive finding; [  ] denotes negative finding  CARDIOVASCULAR:  [ ]  chest pain   [ ]  dyspnea on exertion  [ ]  leg swelling  CONSTITUTIONAL:  [ ]  fever   [ ]  chills  PHYSICAL EXAM: Vitals:   02/06/21 1034  BP: (!) 154/95  Pulse: 66  Resp: 18  Temp: 98.2 F (36.8 C)  TempSrc: Temporal  SpO2: 97%  Weight: 200 lb (90.7 kg)  Height: 5\' 7"  (1.702 m)   GENERAL: The patient is a well-nourished male, in no acute distress. The vital signs are documented above. CARDIOVASCULAR: There is a regular rate and rhythm. PULMONARY: There is good air exchange bilaterally without wheezing or rales. VASCULAR: He had his thigh-high stocking on today and has minimal leg swelling.  He denies significant bruising.  DATA:  VENOUS DUPLEX: I have independently interpreted his venous duplex scan today.  There is no evidence of DVT.  He is undergone successful ablation of the right great saphenous vein from 1.7 cm distal to the saphenofemoral junction to the distal thigh.  MEDICAL ISSUES:  S/P LASER ABLATION LEFT GREAT SAPHENOUS VEIN:  The patient is doing well status post laser ablation of his left great saphenous vein.  He has minimal pain and minimal bruising.  He will wear his thigh-high compression stocking for another week.  After that it may be more practical to wear a knee-high stocking.  We have again discussed the importance of leg elevation.  I have encouraged him to avoid prolonged sitting and standing.  Fortunately at work he gets to move around some.  We have also discussed the importance of exercise specifically walking and water aerobics.  I will see him back as needed.  He has no further procedures planned.  [facility] [employer] [facility] [phone]

## 2021-04-15 ENCOUNTER — Other Ambulatory Visit: Payer: Self-pay

## 2021-04-15 DIAGNOSIS — Z01812 Encounter for preprocedural laboratory examination: Secondary | ICD-10-CM

## 2021-04-15 DIAGNOSIS — I712 Thoracic aortic aneurysm, without rupture, unspecified: Secondary | ICD-10-CM

## 2021-04-15 NOTE — Progress Notes (Signed)
Hest

## 2021-07-03 IMAGING — CT CT ANGIO CHEST
3 of 8 series · 18 of 46 positions shown · IV contrast (OMNIPAQUE 350)
Comparison: None.

CLINICAL DATA: Thoracic aortic aneurysm

EXAM:
CT ANGIOGRAPHY CHEST WITH CONTRAST
TECHNIQUE: Multidetector CT imaging of the chest was performed using the
standard protocol during bolus administration of intravenous
contrast. Multiplanar CT image reconstructions and MIPs were
obtained to evaluate the vascular anatomy.
CONTRAST:  100mL OMNIPAQUE IOHEXOL 350 MG/ML SOLN

[Series 4: aorta 3.0 bf37 2 · axial · 0.72mm/px · z∈[-216,+12]mm · 13 of 90 slices shown]
[im 7/90  lung]
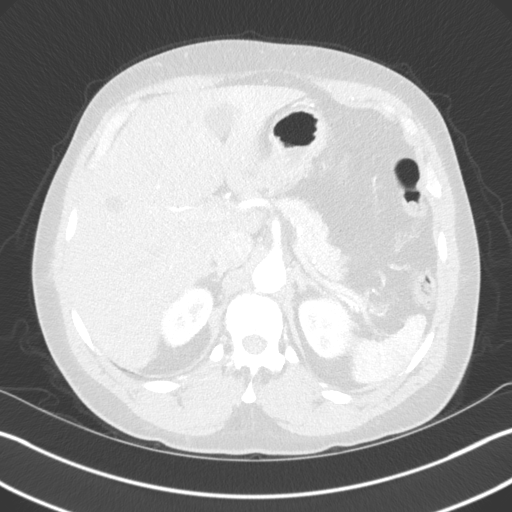
[im 13/90  soft-tissue]
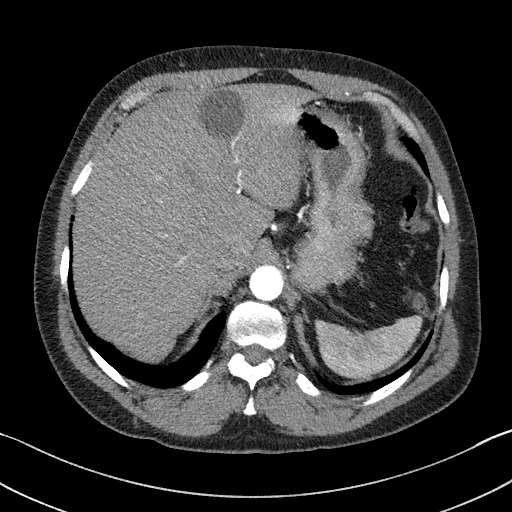
[im 20/90  lung]
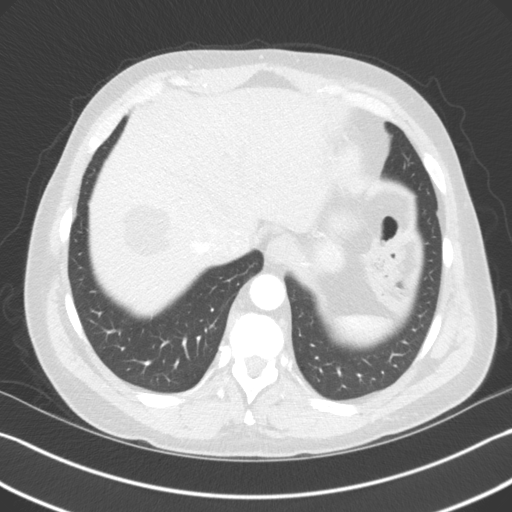
[im 26/90  soft-tissue]
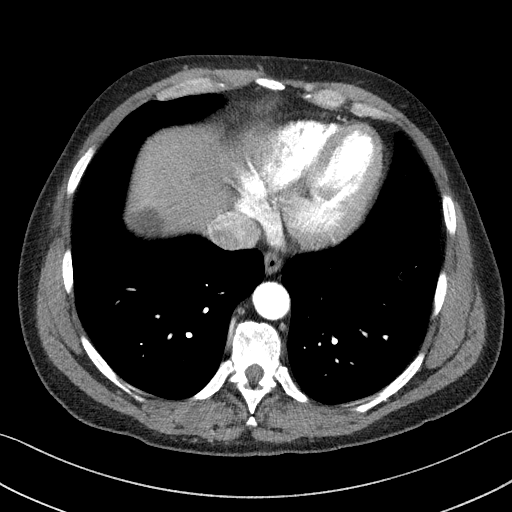
[im 32/90  lung]
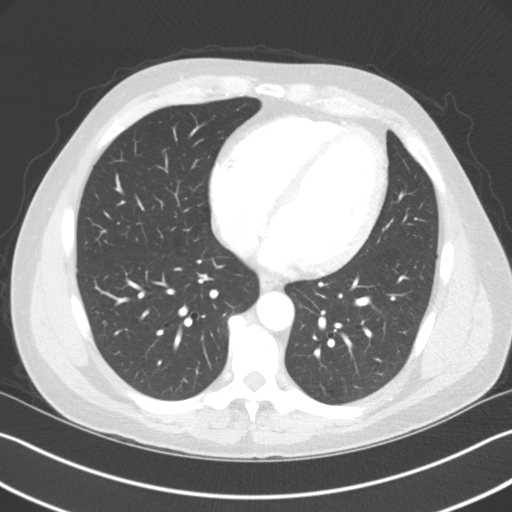
[im 39/90  soft-tissue]
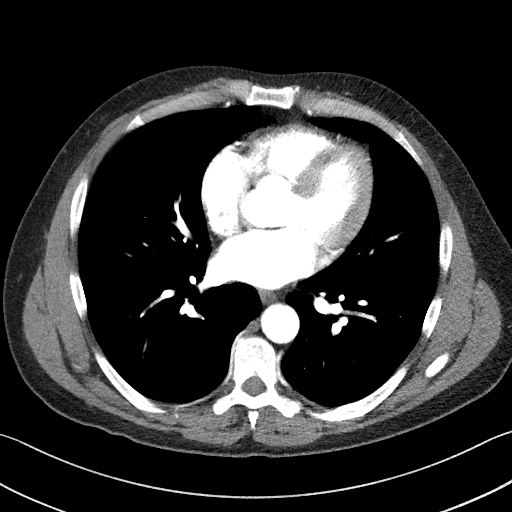
[im 45/90  lung]
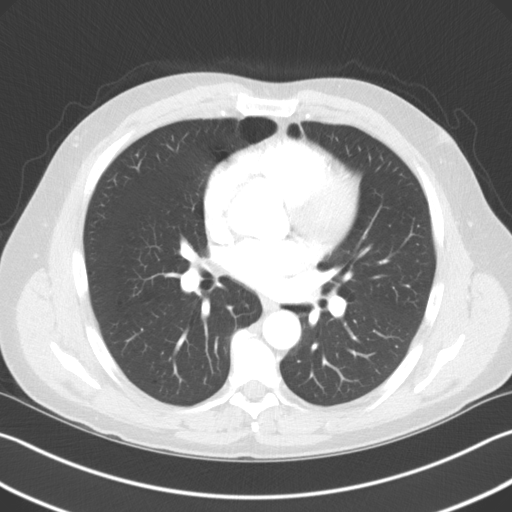
[im 51/90  soft-tissue]
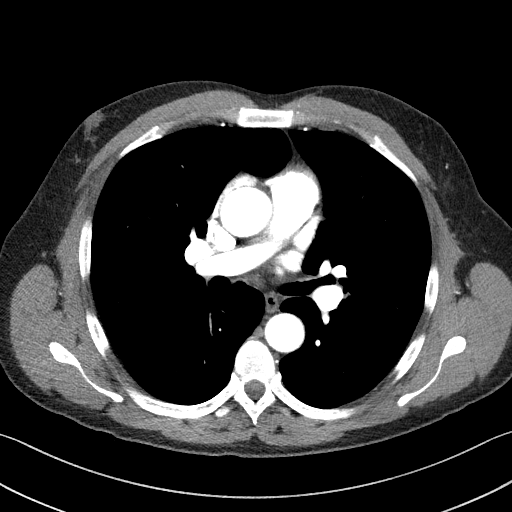
[im 58/90  lung]
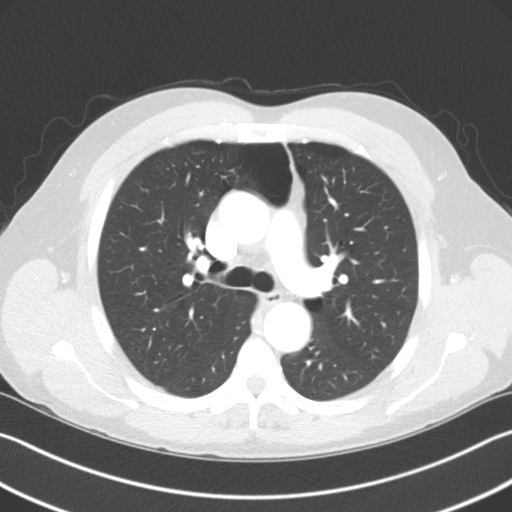
[im 64/90  soft-tissue]
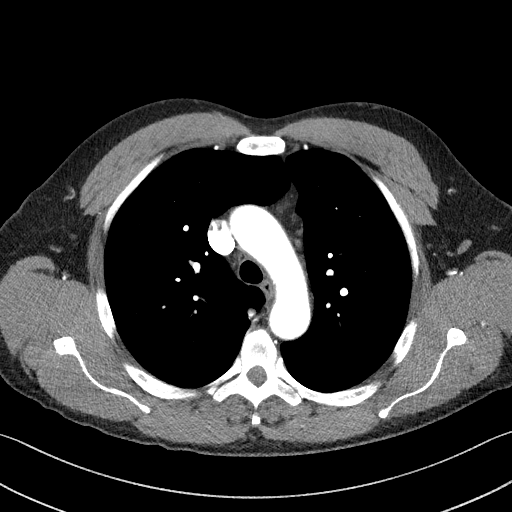
[im 70/90  lung]
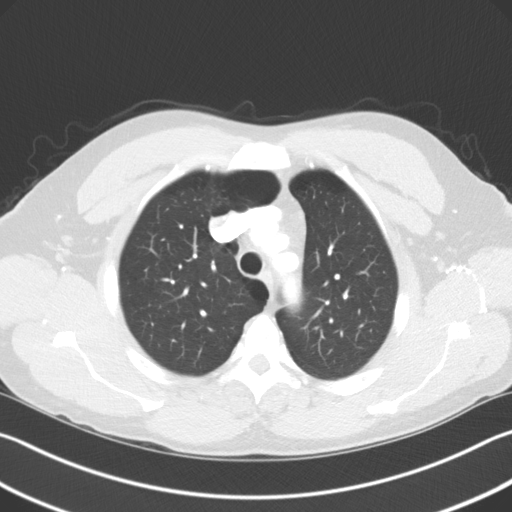
[im 77/90  soft-tissue]
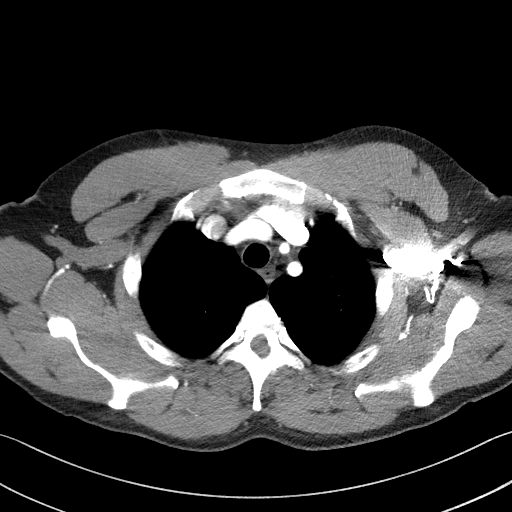
[im 83/90  lung]
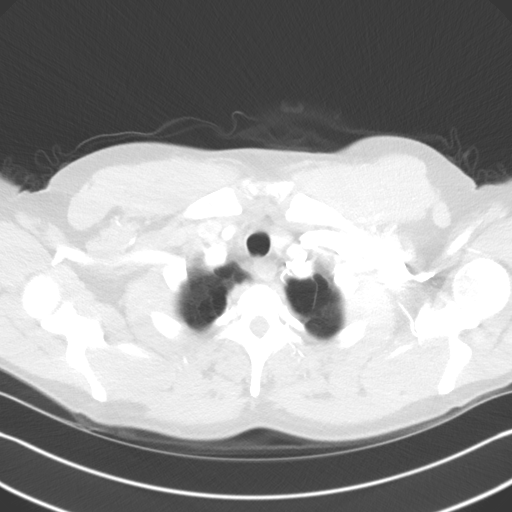

[Series 5: lung · axial · 0.72mm/px · z∈[-216,-177]mm · 2 of 90 slices shown]
[im 7/90  soft-tissue]
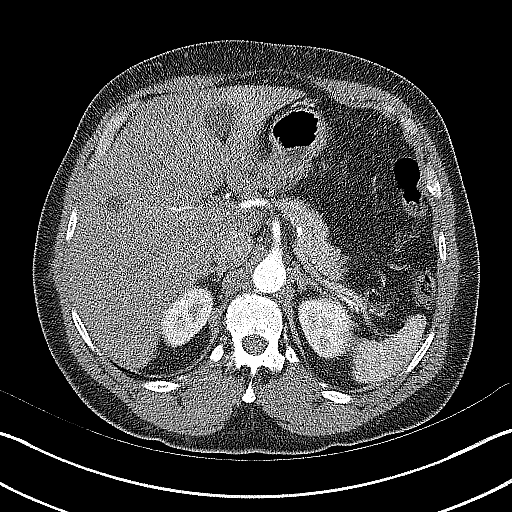
[im 20/90  soft-tissue]
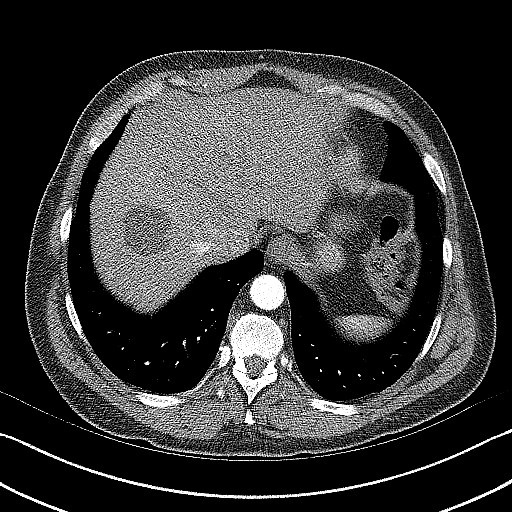

[Series 7: coronals · coronal · 0.56mm/px · 3 of 137 slices shown]
[im 35/137  soft-tissue]
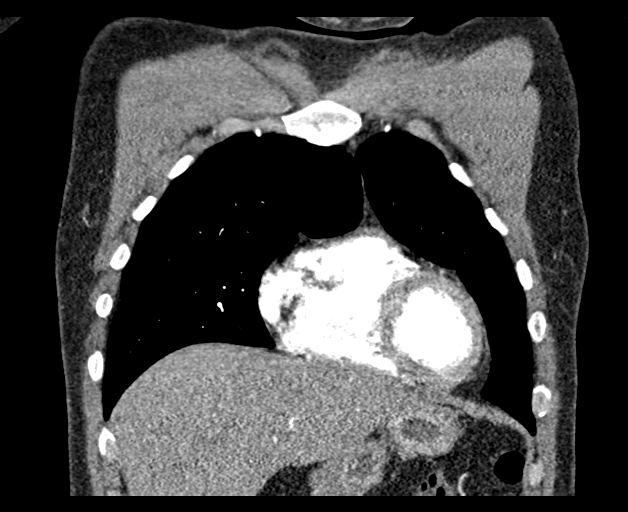
[im 69/137  soft-tissue]
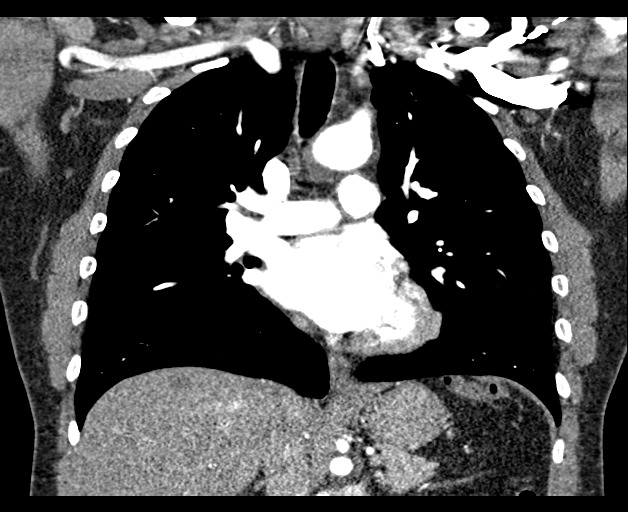
[im 103/137  soft-tissue]
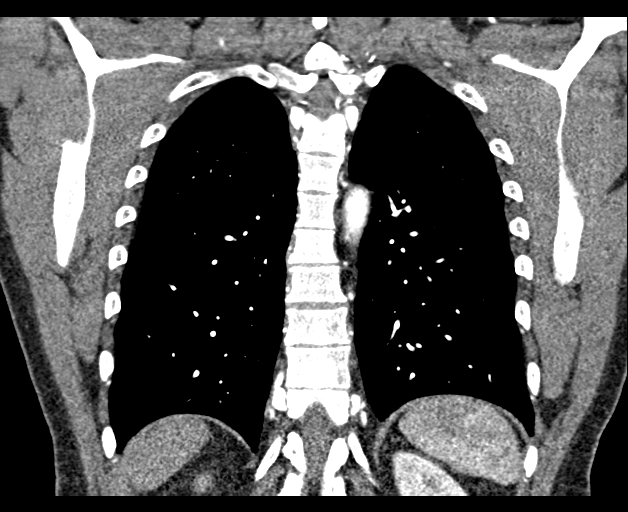

[18 of 46 positions shown; findings below may reference images not displayed]

FINDINGS: Cardiovascular: The thoracic aorta is mildly aneurysmal in its
post-isthmic, proximal descending segment measuring 3.1 x 3.2 cm on
sagittal reformat # 98 and coronal reformat # 93. The vessel
demonstrates an otherwise normal caliber measuring 3.2 x 3.4 cm in
its ascending segment, 2.3 cm in diameter in immediately following
the takeoff of the left subclavian artery, and 2.5 cm in diameter at
the level of the left atrium. There is no evidence of intramural
hematoma or dissection. No significant atherosclerotic plaque
identified. There is no significant collateralization identified to
suggest this represents coarctation of the thoracic aorta with
poststenotic dilation.

Cardiac size within normal limits. No significant coronary artery
calcification. Central pulmonary arteries are of normal caliber
without intraluminal filling defect.

Mediastinum/Nodes: No enlarged mediastinal, hilar, or axillary lymph
nodes. Thyroid gland, trachea, and esophagus demonstrate no
significant findings. Incidental note is made of bilateral
gynecomastia.

Lungs/Pleura: Mild paraseptal bullous emphysema. Lungs are clear. No
pneumothorax or pleural effusion. No central obstructing lesion.

Upper Abdomen: Multiple simple cyst identified within the visualized
liver.

Musculoskeletal: No acute bone abnormality.

Review of the MIP images confirms the above findings.
IMPRESSION: 1. Mild aneurysmal dilatation of the post-isthmic, proximal
descending segment of the thoracic aorta measuring up to 3.1 cm in
diameter.
2. No evidence of intramural hematoma or dissection.
3. Mild paraseptal bullous emphysema.

Emphysema ([id number]).

## 2023-05-05 ENCOUNTER — Ambulatory Visit (INDEPENDENT_AMBULATORY_CARE_PROVIDER_SITE_OTHER): Payer: Medicaid Other | Admitting: Podiatry

## 2023-05-05 ENCOUNTER — Other Ambulatory Visit: Payer: Self-pay | Admitting: Podiatry

## 2023-05-05 DIAGNOSIS — Z79899 Other long term (current) drug therapy: Secondary | ICD-10-CM

## 2023-05-05 DIAGNOSIS — D2372 Other benign neoplasm of skin of left lower limb, including hip: Secondary | ICD-10-CM

## 2023-05-05 DIAGNOSIS — B351 Tinea unguium: Secondary | ICD-10-CM

## 2023-05-05 MED ORDER — TERBINAFINE HCL 250 MG PO TABS: 250.0000 mg | ORAL_TABLET | Freq: Every day | ORAL | 0 refills | Status: AC

## 2023-05-05 NOTE — Progress Notes (Signed)
   Chief Complaint  Patient presents with   Nail Problem    NAIL FUNGUS BILAT   Callouses    CALLUS LEFT MULTIPLES  ABN SIGNED    Subjective: 52 y.o. male presenting today as a new patient for evaluation of symptomatic skin lesions to the plantar aspect of the left foot.  Patient had seen podiatry in the past who performed routine debridements of these lesions.  He says that he no longer sees that physician and he presents today as a referral from his PCP for evaluation and treatment.  Patient also has concern for discoloration and thickening to the toenails.  He says this has been ongoing for several years.  He has tried different topicals with no improvement.  He is concerned for possible toenail fungus.  No past medical history on file.  Past Surgical History:  Procedure Laterality Date   ENDOVENOUS ABLATION SAPHENOUS VEIN W/ LASER Left 01/30/2021   endovenous laser ablation left greater saphenous vein by Cari Caraway MD     No Known Allergies  Objective: Physical Exam General: The patient is alert and oriented x3 in no acute distress.  Dermatology: Hyperkeratotic, discolored, thickened, onychodystrophy noted. Skin is warm, dry and supple bilateral lower extremities. Negative for open lesions or macerations. Hyperkeratotic benign skin lesions noted to the left foot with a central nucleated core and associated tenderness with palpation  Vascular: Palpable pedal pulses bilaterally. No edema or erythema noted. Capillary refill within normal limits.  Neurological: Grossly intact via light touch  Musculoskeletal Exam: No pedal deformity noted  Assessment: #1 Onychomycosis of toenails #2 symptomatic eccrine poroma left foot  Plan of Care:  -[age]-year-old patient was evaluated. -Today we discussed different treatment options including oral, topical, and laser antifungal treatment modalities.  We discussed their efficacies and side effects.  Patient opts for oral antifungal  treatment modality -Prescription for Lamisil 250 mg #90 daily. Pt denies a history of liver pathology or symptoms.   -Order placed for hepatic function panel.  Advised the patient not to take the oral Lamisil until after completion of the hepatic function panel to ensure adequate liver function -Excisional debridement of the hyperkeratotic skin lesions to the left foot was performed today using a 312 scalpel and tissue nipper.  Patient did feel significant relief.  Salicylic acid was applied. -Recommend OTC salicylic acid daily -Return to clinic as needed   Felecia Shelling, DPM [facility]  Dr. Felecia Shelling, DPM    [address] [place], [place] [address]                Office [phone]  Fax [phone]

## 2023-05-06 LAB — HEPATIC FUNCTION PANEL
ALT: 17 IU/L (ref 0–44)
AST: 19 IU/L (ref 0–40)
Albumin: 4.5 g/dL (ref 4.1–5.1)
Alkaline Phosphatase: 81 IU/L (ref 44–121)
Bilirubin Total: 0.4 mg/dL (ref 0.0–1.2)
Bilirubin, Direct: 0.1 mg/dL (ref 0.00–0.40)
Total Protein: 7.3 g/dL (ref 6.0–8.5)

## 2023-05-11 ENCOUNTER — Encounter: Payer: Self-pay | Admitting: Podiatry
